# Patient Record
Sex: Female | Born: 1940 | Race: White | Hispanic: No | Marital: Married | Smoking: Former smoker
Health system: Southern US, Community
[De-identification: ages and names within clinical notes are randomized; demographics above are authoritative.]

## PROBLEM LIST (undated history)

## (undated) DIAGNOSIS — F0390 Unspecified dementia without behavioral disturbance: Secondary | ICD-10-CM

---

## 2011-04-19 ENCOUNTER — Other Ambulatory Visit: Payer: Self-pay | Admitting: Neurology

## 2011-04-19 DIAGNOSIS — R413 Other amnesia: Secondary | ICD-10-CM

## 2011-05-17 ENCOUNTER — Inpatient Hospital Stay: Admission: RE | Admit: 2011-05-17 | Payer: Self-pay | Source: Ambulatory Visit

## 2011-05-24 ENCOUNTER — Ambulatory Visit
Admission: RE | Admit: 2011-05-24 | Discharge: 2011-05-24 | Disposition: A | Discharge status: Home / Self Care | Payer: Self-pay | Source: Ambulatory Visit | Attending: Neurology | Admitting: Neurology

## 2011-05-24 DIAGNOSIS — R413 Other amnesia: Secondary | ICD-10-CM

## 2011-06-21 ENCOUNTER — Other Ambulatory Visit: Payer: Self-pay | Admitting: Neurology

## 2011-06-21 DIAGNOSIS — R413 Other amnesia: Secondary | ICD-10-CM

## 2012-02-23 ENCOUNTER — Ambulatory Visit: Payer: Self-pay | Admitting: Family Medicine

## 2014-01-28 ENCOUNTER — Ambulatory Visit: Payer: Self-pay | Admitting: Family Medicine

## 2014-12-03 ENCOUNTER — Other Ambulatory Visit: Payer: Self-pay | Admitting: Neurology

## 2014-12-03 DIAGNOSIS — F0151 Vascular dementia with behavioral disturbance: Secondary | ICD-10-CM

## 2014-12-03 DIAGNOSIS — G20 Parkinson's disease: Secondary | ICD-10-CM

## 2014-12-03 DIAGNOSIS — F01518 Vascular dementia, unspecified severity, with other behavioral disturbance: Secondary | ICD-10-CM

## 2014-12-10 ENCOUNTER — Ambulatory Visit
Admission: RE | Admit: 2014-12-10 | Discharge: 2014-12-10 | Disposition: A | Discharge status: Home / Self Care | Payer: Medicare PPO | Source: Ambulatory Visit | Attending: Neurology | Admitting: Neurology

## 2014-12-10 DIAGNOSIS — G20 Parkinson's disease: Secondary | ICD-10-CM | POA: Diagnosis present

## 2014-12-10 DIAGNOSIS — I679 Cerebrovascular disease, unspecified: Secondary | ICD-10-CM | POA: Diagnosis not present

## 2014-12-10 DIAGNOSIS — J3489 Other specified disorders of nose and nasal sinuses: Secondary | ICD-10-CM | POA: Insufficient documentation

## 2014-12-10 DIAGNOSIS — F01518 Vascular dementia, unspecified severity, with other behavioral disturbance: Secondary | ICD-10-CM

## 2014-12-10 DIAGNOSIS — G319 Degenerative disease of nervous system, unspecified: Secondary | ICD-10-CM | POA: Insufficient documentation

## 2014-12-10 DIAGNOSIS — F0151 Vascular dementia with behavioral disturbance: Secondary | ICD-10-CM | POA: Insufficient documentation

## 2014-12-10 MED ORDER — GADOBENATE DIMEGLUMINE 529 MG/ML IV SOLN: 20.0000 mL | Freq: Once | INTRAVENOUS | Status: AC | PRN

## 2014-12-10 MED ORDER — GADOBENATE DIMEGLUMINE 529 MG/ML IV SOLN
20.0000 mL | Freq: Once | INTRAVENOUS | Status: AC | PRN
  Administered 2014-12-10: 19 mL via INTRAVENOUS

## 2015-02-06 ENCOUNTER — Encounter: Payer: Self-pay | Admitting: Emergency Medicine

## 2015-02-06 ENCOUNTER — Emergency Department
Admission: EM | Admit: 2015-02-06 | Discharge: 2015-02-06 | Disposition: A | Discharge status: Home / Self Care | Payer: Medicare PPO | Attending: Emergency Medicine | Admitting: Emergency Medicine

## 2015-02-06 ENCOUNTER — Other Ambulatory Visit: Payer: Self-pay

## 2015-02-06 ENCOUNTER — Emergency Department: Payer: Medicare PPO

## 2015-02-06 DIAGNOSIS — M62830 Muscle spasm of back: Secondary | ICD-10-CM | POA: Diagnosis not present

## 2015-02-06 DIAGNOSIS — R000 Tachycardia, unspecified: Secondary | ICD-10-CM | POA: Insufficient documentation

## 2015-02-06 DIAGNOSIS — M5489 Other dorsalgia: Secondary | ICD-10-CM | POA: Diagnosis not present

## 2015-02-06 DIAGNOSIS — Z87891 Personal history of nicotine dependence: Secondary | ICD-10-CM | POA: Insufficient documentation

## 2015-02-06 DIAGNOSIS — R319 Hematuria, unspecified: Secondary | ICD-10-CM

## 2015-02-06 DIAGNOSIS — M549 Dorsalgia, unspecified: Secondary | ICD-10-CM | POA: Diagnosis present

## 2015-02-06 HISTORY — DX: Unspecified dementia, unspecified severity, without behavioral disturbance, psychotic disturbance, mood disturbance, and anxiety: F03.90

## 2015-02-06 LAB — CBC WITH DIFFERENTIAL/PLATELET
Basophils Absolute: 0 10*3/uL (ref 0–0.1)
Basophils Relative: 0 %
Eosinophils Absolute: 0 10*3/uL (ref 0–0.7)
Eosinophils Relative: 0 %
HEMATOCRIT: 43.8 % (ref 35.0–47.0)
HEMOGLOBIN: 14.9 g/dL (ref 12.0–16.0)
LYMPHS ABS: 2 10*3/uL (ref 1.0–3.6)
LYMPHS PCT: 14 %
MCH: 29.5 pg (ref 26.0–34.0)
MCHC: 33.9 g/dL (ref 32.0–36.0)
MCV: 87 fL (ref 80.0–100.0)
MONOS PCT: 9 %
Monocytes Absolute: 1.2 10*3/uL — ABNORMAL HIGH (ref 0.2–0.9)
NEUTROS ABS: 10.6 10*3/uL — AB (ref 1.4–6.5)
NEUTROS PCT: 77 %
Platelets: 231 10*3/uL (ref 150–440)
RBC: 5.04 MIL/uL (ref 3.80–5.20)
RDW: 13.5 % (ref 11.5–14.5)
WBC: 13.7 10*3/uL — AB (ref 3.6–11.0)

## 2015-02-06 LAB — LACTIC ACID, PLASMA
Lactic Acid, Venous: 2.9 mmol/L (ref 0.5–2.0)
Lactic Acid, Venous: 1.9 mmol/L (ref 0.5–2.0)

## 2015-02-06 LAB — URINALYSIS COMPLETE WITH MICROSCOPIC (ARMC ONLY)
BILIRUBIN URINE: NEGATIVE
Bacteria, UA: NONE SEEN
Glucose, UA: NEGATIVE mg/dL
Leukocytes, UA: NEGATIVE
NITRITE: NEGATIVE
Protein, ur: NEGATIVE mg/dL
SPECIFIC GRAVITY, URINE: 1.024 (ref 1.005–1.030)
pH: 6 (ref 5.0–8.0)

## 2015-02-06 LAB — COMPREHENSIVE METABOLIC PANEL
ALK PHOS: 55 U/L (ref 38–126)
ALT: 13 U/L — AB (ref 14–54)
AST: 29 U/L (ref 15–41)
Albumin: 4.5 g/dL (ref 3.5–5.0)
Anion gap: 12 (ref 5–15)
BUN: 20 mg/dL (ref 6–20)
CALCIUM: 10.4 mg/dL — AB (ref 8.9–10.3)
CO2: 24 mmol/L (ref 22–32)
CREATININE: 0.82 mg/dL (ref 0.44–1.00)
Chloride: 102 mmol/L (ref 101–111)
GFR calc non Af Amer: >60 mL/min (ref >60)
GLUCOSE: 97 mg/dL (ref 65–99)
Potassium: 4.7 mmol/L (ref 3.5–5.1)
SODIUM: 138 mmol/L (ref 135–145)
Total Bilirubin: 0.8 mg/dL (ref 0.3–1.2)
Total Protein: 7.7 g/dL (ref 6.5–8.1)

## 2015-02-06 LAB — TROPONIN I

## 2015-02-06 LAB — LIPASE, BLOOD: Lipase: 21 U/L — ABNORMAL LOW (ref 22–51)

## 2015-02-06 MED ORDER — SODIUM CHLORIDE 0.9 % IV BOLUS (SEPSIS)
1000.0000 mL | Freq: Once | INTRAVENOUS | Status: AC
  Administered 2015-02-06: 1000 mL via INTRAVENOUS

## 2015-02-06 MED ORDER — DIAZEPAM 2 MG PO TABS
2.0000 mg | ORAL_TABLET | Freq: Once | ORAL | Status: AC
  Administered 2015-02-06: 2 mg via ORAL
  Filled 2015-02-06: qty 1

## 2015-02-06 MED ORDER — DIAZEPAM 2 MG PO TABS: 2.0000 mg | ORAL_TABLET | Freq: Three times a day (TID) | ORAL | Status: AC | PRN

## 2015-02-06 MED ORDER — KETOROLAC TROMETHAMINE 30 MG/ML IJ SOLN
30.0000 mg | Freq: Once | INTRAMUSCULAR | Status: AC
  Administered 2015-02-06: 30 mg via INTRAVENOUS
  Filled 2015-02-06: qty 1

## 2015-02-06 MED ORDER — IBUPROFEN 600 MG PO TABS
600.0000 mg | ORAL_TABLET | Freq: Once | ORAL | Status: AC
  Administered 2015-02-06: 600 mg via ORAL
  Filled 2015-02-06: qty 1

## 2015-02-06 NOTE — Discharge Instructions (Signed)
I suspect your back pain is due to muscle spasms, and your being started on an as needed medication called Valium for muscle spasms. Return to emergency department for any worsening condition including weakness, numbness, incontinence of urine or stool, fever, altered mental status, or any other symptoms concerning to you. Your urine showed blood in it, and I recommend follow-up with a urologist. If primary care physician can refer you for urology follow-up.   Back Pain, Adult Back pain is very common. The pain often gets better over time. The cause of back pain is usually not dangerous. Most people can learn to manage their back pain on their own.  HOME CARE   Stay active. Start with short walks on flat ground if you can. Try to walk farther each day.  Do not sit, drive, or stand in one place for more than 30 minutes. Do not stay in bed.  Do not avoid exercise or work. Activity can help your back heal faster.  Be careful when you bend or lift an object. Bend at your knees, keep the object close to you, and do not twist.  Sleep on a firm mattress. Lie on your side, and bend your knees. If you lie on your back, put a pillow under your knees.  Only take medicines as told by your doctor.  Put ice on the injured area.  Put ice in a plastic bag.  Place a towel between your skin and the bag.  Leave the ice on for 15-20 minutes, 03-04 times a day for the first 2 to 3 days. After that, you can switch between ice and heat packs.  Ask your doctor about back exercises or massage.  Avoid feeling anxious or stressed. Find good ways to deal with stress, such as exercise. GET HELP RIGHT AWAY IF:   Your pain does not go away with rest or medicine.  Your pain does not go away in 1 week.  You have new problems.  You do not feel well.  The pain spreads into your legs.  You cannot control when you poop (bowel movement) or pee (urinate).  Your arms or legs feel weak or lose feeling  (numbness).  You feel sick to your stomach (nauseous) or throw up (vomit).  You have belly (abdominal) pain.  You feel like you may pass out (faint). MAKE SURE YOU:   Understand these instructions.  Will watch your condition.  Will get help right away if you are not doing well or get worse. Document Released: 11/17/2007 Document Revised: 08/23/2011 Document Reviewed: 10/02/2013 [facility] Patient Information 2015 [place], [place]. This information is not intended to replace advice given to you by your health care provider. Make sure you discuss any questions you have with your health care provider.  Musculoskeletal Pain Musculoskeletal pain is muscle and boney aches and pains. These pains can occur in any part of the body. Your caregiver may treat you without knowing the cause of the pain. They may treat you if blood or urine tests, X-rays, and other tests were normal.  CAUSES There is often not a definite cause or reason for these pains. These pains may be caused by a type of germ (virus). The discomfort may also come from overuse. Overuse includes working out too hard when your body is not fit. Boney aches also come from weather changes. Bone is sensitive to atmospheric pressure changes. HOME CARE INSTRUCTIONS   Ask when your test results will be ready. Make sure you get your test  results.  Only take over-the-counter or prescription medicines for pain, discomfort, or fever as directed by your caregiver. If you were given medications for your condition, do not drive, operate machinery or power tools, or sign legal documents for 24 hours. Do not drink alcohol. Do not take sleeping pills or other medications that may interfere with treatment.  Continue all activities unless the activities cause more pain. When the pain lessens, slowly resume normal activities. Gradually increase the intensity and duration of the activities or exercise.  During periods of severe pain, bed rest may be helpful.  Lay or sit in any position that is comfortable.  Putting ice on the injured area.  Put ice in a bag.  Place a towel between your skin and the bag.  Leave the ice on for 15 to 20 minutes, 3 to 4 times a day.  Follow up with your caregiver for continued problems and no reason can be found for the pain. If the pain becomes worse or does not go away, it may be necessary to repeat tests or do additional testing. Your caregiver may need to look further for a possible cause. SEEK IMMEDIATE MEDICAL CARE IF:  You have pain that is getting worse and is not relieved by medications.  You develop chest pain that is associated with shortness or breath, sweating, feeling sick to your stomach (nauseous), or throw up (vomit).  Your pain becomes localized to the abdomen.  You develop any new symptoms that seem different or that concern you. MAKE SURE YOU:   Understand these instructions.  Will watch your condition.  Will get help right away if you are not doing well or get worse. Document Released: 05/31/2005 Document Revised: 08/23/2011 Document Reviewed: 02/02/2013 [facility] Patient Information 2015 [place], [place]. This information is not intended to replace advice given to you by your health care provider. Make sure you discuss any questions you have with your health care provider.    Hematuria Hematuria is blood in your urine. It can be caused by a bladder infection, kidney infection, prostate infection, kidney stone, or cancer of your urinary tract. Infections can usually be treated with medicine, and a kidney stone usually will pass through your urine. If neither of these is the cause of your hematuria, further workup to find out the reason may be needed. It is very important that you tell your health care provider about any blood you see in your urine, even if the blood stops without treatment or happens without causing pain. Blood in your urine that happens and then stops and then happens  again can be a symptom of a very serious condition. Also, pain is not a symptom in the initial stages of many urinary cancers. HOME CARE INSTRUCTIONS   Drink lots of fluid, 3-4 quarts a day. If you have been diagnosed with an infection, cranberry juice is especially recommended, in addition to large amounts of water.  Avoid caffeine, tea, and carbonated beverages because they tend to irritate the bladder.  Avoid alcohol because it may irritate the prostate.  Take all medicines as directed by your health care provider.  If you were prescribed an antibiotic medicine, finish it all even if you start to feel better.  If you have been diagnosed with a kidney stone, follow your health care provider's instructions regarding straining your urine to catch the stone.  Empty your bladder often. Avoid holding urine for long periods of time.  After a bowel movement, women should cleanse front to back.  Use each tissue only once.  Empty your bladder before and after sexual intercourse if you are a female. SEEK MEDICAL CARE IF:  You develop back pain.  You have a fever.  You have a feeling of sickness in your stomach (nausea) or vomiting.  Your symptoms are not better in 3 days. Return sooner if you are getting worse. SEEK IMMEDIATE MEDICAL CARE IF:   You develop severe vomiting and are unable to keep the medicine down.  You develop severe back or abdominal pain despite taking your medicines.  You begin passing a large amount of blood or clots in your urine.  You feel extremely weak or faint, or you pass out. MAKE SURE YOU:   Understand these instructions.  Will watch your condition.  Will get help right away if you are not doing well or get worse. Document Released: 05/31/2005 Document Revised: 10/15/2013 Document Reviewed: 01/29/2013 [facility] Patient Information 2015 [place], [place]. This information is not intended to replace advice given to you by your health care provider. Make  sure you discuss any questions you have with your health care provider.

## 2015-02-06 NOTE — ED Notes (Signed)
From home via EMS with c/o back pain, denies pain on arrival, VSS, no trauma, neuro intact to baseline, NAD

## 2015-02-06 NOTE — ED Provider Notes (Signed)
-----------------------------------------   545 PM on 02/06/2015 -----------------------------------------  Patient presented with 1 day of worsening back pain. Spasm felt on exam by Dr. Shaune Pollack. No known trauma. Given by mouth Valium for muscle spasm by Dr. Shaune Pollack. Plan is to reassess as well as follow-up with studies a renal stone because of hematuria. Physical Exam  BP 144/93 mmHg  Pulse 95  Temp(Src) 98.2 F (36.8 C) (Oral)  Resp 18  SpO2 95%  Physical Exam Patient resting comfortable at this time and able to shift herself in the bed without any severe back pain. ED Course  Procedures  MDM CAT scan renal study without any findings to explain the patient's back pain and hematuria. No acute disease on the plain films.  Ultrasound-guided IV started in the right arm secondary to multiple unsuccessful IV sticks. 20-gauge long catheter used for IV access. Site prepped sterilely. There was a good return of maroon blood which was nonpulsatile. Easily flushes and draws back.   ED ECG REPORT I, Arelia Longest, the attending physician, personally viewed and interpreted this ECG.   Date: 02/06/2015  EKG Time: 1821  Rate: 87  Rhythm: normal sinus rhythm  Axis: Normal axis  Intervals:Incomplete right bundle-branch block  ST&T Change: T-wave inversion in V2 possibly from lead placement. Otherwise the EKG does not have any abnormal T-wave inversions. No  ST elevations or depressions.  ----------------------------------------- 7:29 PM on 02/06/2015 -----------------------------------------  Patient is now able to ambulate with assistance. No palpable tenderness to the back at this time. Patient is sitting now on the couch in the room without any acute distress. Unclear cause of the elevated lactate however patient does not have any obvious infectious source. Patient was afebrile and pulse normalized. Possibly due to pain and muscle contraction in the back. We'll discharge to home. Pain  initially was lateralizing.  No saddle anesthesia on my exam. Unlikely cord compression syndrome or infectious source such as epidural abscess.   Myrna Blazer, MD 02/06/15 [phone]

## 2015-02-06 NOTE — ED Provider Notes (Addendum)
[facility] Emergency Department Provider Note   ____________________________________________  Time seen: 1:20 PM I have reviewed the triage vital signs and the triage nursing note.  HISTORY  Chief Complaint Back Pain   Historian Patient is a poor historian due to dementia. History provided by the care home staff, as well as a granddaughter is at the bedside.  HPI Erika Ramirez is a 74 y.o. female who has a history of Parkinson's dementia and for 2 days has had intermittent complaints of back pain. Yesterday upon waking she was unable to get out of bed herself and was complaining of severe back pain. EMS was called at that point in time and by the time they came to the home, the patient was able to get up on her own and walk around and then she did well the rest of the day. This morning the staff went in to get her up and she was laying sideways in the bed and she was complaining of pain any time she was moved in her back. She's been in bed all day and hasn't been able to get up due to back pain. No known trauma. No altered mental status from baseline. No recent illness or appetite changes.    Past Medical History  Diagnosis Date  . Dementia     There are no active problems to display for this patient.   No past surgical history on file.  Current Outpatient Rx  Name  Route  Sig  Dispense  Refill  . diazepam (VALIUM) 2 MG tablet   Oral   Take 1 tablet (2 mg total) by mouth every 8 (eight) hours as needed for muscle spasms.   10 tablet   0     Allergies Review of patient's allergies indicates not on file.  No family history on file.  Social History Social History  Substance Use Topics  . Smoking status: Former Games developer  . Smokeless tobacco: Not on file  . Alcohol Use: No    Review of Systems  Constitutional: Negative for fever. Eyes: Negative for visual changes. ENT: Negative for sore throat. Cardiovascular: Negative for chest  pain. Respiratory: Negative for shortness of breath. Gastrointestinal: Negative for abdominal pain, vomiting and diarrhea. Genitourinary: Negative for dysuria. Musculoskeletal: Negative for extremity pain Skin: Negative for rash. Neurological: Negative for headache. 10 point Review of Systems otherwise negative ____________________________________________   PHYSICAL EXAM:  VITAL SIGNS: ED Triage Vitals  Enc Vitals Group     BP 02/06/15 1215 143/108 mmHg     Pulse Rate 02/06/15 1215 108     Resp --      Temp 02/06/15 1215 98.2 F (36.8 C)     Temp Source 02/06/15 1215 Oral     SpO2 02/06/15 1215 95 %     Weight --      Height --      Head Cir --      Peak Flow --      Pain Score --      Pain Loc --      Pain Edu? --      Excl. in GC? --      Constitutional: Alert and follows some commands, but confused and a poor historian. In no distress. Eyes: Conjunctivae are normal. PERRL. Normal extraocular movements. ENT   Head: Normocephalic and atraumatic.   Nose: No congestion/rhinnorhea.   Mouth/Throat: Mucous membranes are moist.   Neck: No stridor. Cardiovascular/Chest: Regular and tachycardic.  No murmurs, rubs, or gallops.  Respiratory: Normal respiratory effort without tachypnea nor retractions. Breath sounds are clear and equal bilaterally. No wheezes/rales/rhonchi. Gastrointestinal: Soft. No distention, no guarding, no rebound. Nontender   Genitourinary/rectal:Deferred Musculoskeletal: Nontender with normal range of motion in all extremities. No joint effusions.  No lower extremity tenderness.  No edema. Patient seems to be tender all up and down her back from midthoracic down to the lumbar. She does have palpable stiff muscle spasm paraspinous especially on the right side of her back. Neurologic:  Minimal language. Patient is reportedly at her baseline mental status. No gross or focal neurologic deficits are appreciated. Skin:  Skin is warm, dry and intact.  No rash noted.   ____________________________________________   EKG I, Governor Rooks, MD, the attending physician have personally viewed and interpreted all ECGs.  Pending ____________________________________________  LABS (pertinent positives/negatives)  Urinalysis: Trace ketones, too numerous to count red blood cells and otherwise no sign of urinary tract infection Metabolic panel and CBC are pending  ____________________________________________  RADIOLOGY All Xrays were viewed by me. Imaging interpreted by Radiologist.  Chest x-ray:  IMPRESSION: Mild diffuse reticular densities are noted bilaterally concerning for possible edema or atypical inflammation. Follow-up radiographs are recommended.  CT stone study: Pending Lumbar spine 2 view: No acute fracture dislocation. Degenerative joint changes of the spine  Thoracic spine 2 view: No acute fracture or dislocation. Degenerative joint changes of the spine. __________________________________________  PROCEDURES  Procedure(s) performed: None  Critical Care performed: None  ____________________________________________   ED COURSE / ASSESSMENT AND PLAN  CONSULTATIONS: None  Pertinent labs & imaging results that were available during my care of the patient were reviewed by me and considered in my medical decision making (see chart for details).  It sounds as if clinically she might be having muscle spasms in her back that are limiting her movement, however I am going to assess with screening labs, urine, and x-rays to assess for compression fractures. I'm going to try Valium for symptom relief of what seems like muscle spasms in her back.   X-ray showed no fracture. Chest x-ray is nonspecific, and will recommend outpatient follow-up radiographs.  Urinalysis, blood work are pending. Patient care transferred to Dr. Langston Masker at shift change for p.m.  If laboratory evaluation is reassuring, patient will be discharged  with my discharge instructions a prescription for Valium.Marland Kitchen  ----------------------------------------- 3:51 PM on 02/06/2015 -----------------------------------------  Urinalysis returned with hematuria. In the department for a CT stone study. Labs, EKG, and CT stone study are pending for Dr. Langston Masker.  Patient / Family / Caregiver informed of clinical course, medical decision-making process, and agree with plan.    ___________________________________________   FINAL CLINICAL IMPRESSION(S) / ED DIAGNOSES   Final diagnoses:  Other back pain  Muscle spasm of back       Governor Rooks, MD 02/06/15 1547  Governor Rooks, MD 02/06/15 [phone]

## 2015-02-07 ENCOUNTER — Emergency Department
Admission: EM | Admit: 2015-02-07 | Discharge: 2015-02-07 | Disposition: A | Discharge status: Home / Self Care | Payer: Medicare PPO | Attending: Emergency Medicine | Admitting: Emergency Medicine

## 2015-02-07 ENCOUNTER — Encounter: Payer: Self-pay | Admitting: *Deleted

## 2015-02-07 DIAGNOSIS — G20 Parkinson's disease: Secondary | ICD-10-CM | POA: Insufficient documentation

## 2015-02-07 DIAGNOSIS — R531 Weakness: Secondary | ICD-10-CM

## 2015-02-07 DIAGNOSIS — M6281 Muscle weakness (generalized): Secondary | ICD-10-CM | POA: Insufficient documentation

## 2015-02-07 DIAGNOSIS — G20A1 Parkinson's disease without dyskinesia, without mention of fluctuations: Secondary | ICD-10-CM

## 2015-02-07 DIAGNOSIS — Z87891 Personal history of nicotine dependence: Secondary | ICD-10-CM | POA: Insufficient documentation

## 2015-02-07 DIAGNOSIS — M545 Low back pain: Secondary | ICD-10-CM | POA: Diagnosis present

## 2015-02-07 NOTE — Care Management Note (Signed)
Case Management Note  Patient Details  Name: Erika Ramirez MRN: [id number] Date of Birth: 1940-11-15  Subjective/Objective:   Spoke to Erika Hughs, FNP  At  [facility].She contacted Erika Ramirez, who will see the pt. 9:20am Monday morning. The family and caregiver both say that the pt. Just saw the MD on 8/10.  The practice has the contacts to work on placing pt. At a higher level of care if truly needed.  Although the caregiver states the pt. Screams in pain when moved, the pt. Is quiet and no c/o pain here in hospital.    This is the 3rd visit for same c/o of decreased mobility. They had already talked to Erika Ramirez about moving to another facility but state thay thought they would have more time to do so.              Action/Plan:   Expected Discharge Date:                  Expected Discharge Plan:     In-House Referral:     Discharge planning Services     Post Acute Care Choice:    Choice offered to:     DME Arranged:    DME Agency:     HH Arranged:    HH Agency:     Status of Service:     Medicare Important Message Given:    Date Medicare IM Given:    Medicare IM give by:    Date Additional Medicare IM Given:    Additional Medicare Important Message give by:     If discussed at Long Length of Stay Meetings, dates discussed:    Additional Comments:  Berna Bue, RN 02/07/2015, 1:15 PM

## 2015-02-07 NOTE — Clinical Social Work Note (Signed)
Clinical Social Work Assessment  Patient Details  Name: Erika Ramirez MRN: [id number] Date of Birth: 09-18-1940  Date of referral:  02/07/15               Reason for consult:  Facility Placement                Permission sought to share information with:  Facility Medical sales representative, Family Supports Permission granted to share information::  Yes, Verbal Permission Granted  Name::      (daughter Neysa Bonito [phone])  Agency::   [name] [phone]  Lupita Leash) )  Relationship::     Contact Information:     Housing/Transportation Living arrangements for the past 2 months:  Assisted Living Facility Source of Information:  Adult Children, Facility, Patient Patient Interpreter Needed:  None Criminal Activity/Legal Involvement Pertinent to Current Situation/Hospitalization:  No - Comment as needed Significant Relationships:  Adult Children (family care home) Lives with:    Do you feel safe going back to the place where you live?  Yes Need for family participation in patient care:  Yes (Comment)  Care giving concerns:  Concerned that patient will need a higher level of care and unable to meet needs at ALF.   Social Worker assessment / plan:  Erika Ramirez is a 74 y.o. female who presents from assisted living for pain.  Has come to ED several time in the past two days, facility thinks she needs a higher level of care, CSW in to meet with patient.  Daughter and family care home owner at bedside.  Patient currently lives at [facility] Per owner patient has had increased weakness and increasing pain in the low back.    Informed CSW patient has never been to SNF or had home health rehab in the past.  She has a follow up appointment in two weeks with PCP to review FL2 to determine if patient needs a higher level of care.  Care management was able to obtain an appointment for patient on Monday.  CSW provided patient's daughter with a SNF list to take with them in  the event they need SNF placement after appointment Monday.  Patient will discharge back to ALF with follow up appointment on Monday with PCP to determine patient's ongoing needs and level of care.   Employment status:  Disabled (Comment on whether or not currently receiving Disability) Insurance information:  Medicare, Managed Care PT Recommendations:  Not assessed at this time Information / Referral to community resources:   (follow up with PCP)  Patient/Family's Response to care:  Patient's daughter and ALF appreciative of getting the appointment sooner.   Patient/Family's Understanding of and Emotional Response to Diagnosis, Current Treatment, and Prognosis:  Patient's family understands patient will discharge back to ALF with follow up appointment on Monday.  Emotional Assessment Appearance:  Appears stated age Attitude/Demeanor/Rapport:    Affect (typically observed):  Appropriate, Calm, Pleasant, Quiet Orientation:  Oriented to Self Alcohol / Substance use:    Psych involvement (Current and /or in the community):     Discharge Needs  Concerns to be addressed:  Care Coordination Readmission within the last 30 days:  Yes Current discharge risk:  Physical Impairment, Chronically ill, Dependent with Mobility Barriers to Discharge:  No Barriers Identified   Erika Pilon, LCSW 02/07/2015, 1:32 PM Erika Ramirez. Erika Ramirez, MSW Clinical Social Work Department Emergency Room [phone] 1:35 PM

## 2015-02-07 NOTE — Discharge Instructions (Signed)
Parkinson Disease Parkinson disease is a disorder of the central nervous system, which includes the brain and spinal cord. A person with this disease slowly loses the ability to completely control body movements. Within the brain, there is a group of nerve cells (basal ganglia) that help control movement. The basal ganglia are damaged and do not work properly in a person with Parkinson disease. In addition, the basal ganglia produce and use a brain chemical called dopamine. The dopamine chemical sends messages to other parts of the body to control and coordinate body movements. Dopamine levels are low in a person with Parkinson disease. If the dopamine levels are low, then the body does not receive the correct messages it needs to move normally.  CAUSES  The exact reason why the basal ganglia get damaged is not known. Some medical researchers have thought that infection, genes, environment, and certain medicines may contribute to the cause.  SYMPTOMS   An early symptom of Parkinson disease is often an uncontrolled shaking (tremor) of the hands. The tremor will often disappear when the affected hand is consciously used.  As the disease progresses, walking, talking, getting out of a chair, and new movements become more difficult.  Muscles get stiff and movements become slower.  Balance and coordination become harder.  Depression, trouble swallowing, urinary problems, constipation, and sleep problems can occur.  Later in the disease, memory and thought processes may deteriorate. DIAGNOSIS  There are no specific tests to diagnose Parkinson disease. You may be referred to a neurologist for evaluation. Your caregiver will ask about your medical history, symptoms, and perform a physical exam. Blood tests and imaging tests of your brain may be performed to rule out other diseases. The imaging tests may include an MRI or a CT scan. TREATMENT  The goal of treatment is to relieve symptoms. Medicines may be  prescribed once the symptoms become troublesome. Medicine will not stop the progression of the disease, but medicine can make movement and balance better and help control tremors. Speech and occupational therapy may also be prescribed. Sometimes, surgical treatment of the brain can be done in young people. HOME CARE INSTRUCTIONS  Get regular exercise and rest periods during the day to help prevent exhaustion and depression.  If getting dressed becomes difficult, replace buttons and zippers with Velcro and elastic on your clothing.  Take all medicine as directed by your caregiver.  Install grab bars or railings in your home to prevent falls.  Go to speech or occupational therapy as directed.  Keep all follow-up visits as directed by your caregiver. SEEK MEDICAL CARE IF:  Your symptoms are not controlled with your medicine.  You fall.  You have trouble swallowing or choke on your food. MAKE SURE YOU:  Understand these instructions.  Will watch your condition.  Will get help right away if you are not doing well or get worse. Document Released: 05/28/2000 Document Revised: 09/25/2012 Document Reviewed: 06/30/2011 [employer] Patient Information 2015 [employer]. This information is not intended to replace advice given to you by your health care provider. Make sure you discuss any questions you have with your health care provider.  

## 2015-02-07 NOTE — ED Provider Notes (Addendum)
[facility] Emergency Department Provider Note     Time seen: ----------------------------------------- 12:01 PM on 02/07/2015 -----------------------------------------    I have reviewed the triage vital signs and the nursing notes. L5 caveat: Review of systems and history difficult to obtain due to Parkinson's and dementia  HISTORY  Chief Complaint Back Pain    HPI Erika Ramirez is a 74 y.o. female who presents from assisted living for pain. According to report the facility thinks she needs a higher level of care, was told to come to the ER for admission to the hospital. Patient has had increased weakness and increasing pain in the low back. She had been taking Valium for muscle spasms, was able to stand up with assistance by EMS.   Past Medical History  Diagnosis Date  . Dementia     There are no active problems to display for this patient.   History reviewed. No pertinent past surgical history.  Allergies Review of patient's allergies indicates no known allergies.  Social History Social History  Substance Use Topics  . Smoking status: Former Games developer  . Smokeless tobacco: None  . Alcohol Use: No    Review of Systems Constitutional: Negative for fever. Eyes: Negative for visual changes. ENT: Negative for sore throat. Cardiovascular: Negative for chest pain. Respiratory: Negative for shortness of breath. Gastrointestinal: Negative for abdominal pain, vomiting and diarrhea. Genitourinary: Negative for dysuria. Musculoskeletal: Positive for low back pain Skin: Negative for rash. Neurological: Negative for headaches, positive for weakness  10-point ROS otherwise negative.  ____________________________________________   PHYSICAL EXAM:  VITAL SIGNS: ED Triage Vitals  Enc Vitals Group     BP 02/07/15 1116 136/78 mmHg     Pulse Rate 02/07/15 1116 88     Resp --      Temp 02/07/15 1116 98.5 F (36.9 C)     Temp Source 02/07/15  1116 Oral     SpO2 02/07/15 1116 96 %     Weight 02/07/15 1116 200 lb (90.719 kg)     Height 02/07/15 1116  (1.626 m)     Head Cir --      Peak Flow --      Pain Score --      Pain Loc --      Pain Edu? --      Excl. in GC? --     Constitutional: Alert but disoriented. Well appearing and in no distress. Eyes: Conjunctivae are normal. PERRL. Normal extraocular movements. ENT   Head: Normocephalic and atraumatic.   Nose: No congestion/rhinnorhea.   Mouth/Throat: Mucous membranes are moist.   Neck: No stridor. Cardiovascular: Normal rate, regular rhythm. Normal and symmetric distal pulses are present in all extremities. No murmurs, rubs, or gallops. Respiratory: Normal respiratory effort without tachypnea nor retractions. Breath sounds are clear and equal bilaterally. No wheezes/rales/rhonchi. Gastrointestinal: Soft and nontender. No distention. No abdominal bruits.  Musculoskeletal: Nontender with mild pain with range of motion of her lower extremities Mild edema  Neurologic:  Normal speech and language Skin:  Skin is warm, dry and intact. No rash noted.  ____________________________________________  ED COURSE:  Pertinent labs & imaging results that were available during my care of the patient were reviewed by me and considered in my medical decision making (see chart for details). Patient is in no acute distress, likely needs a higher level of care or skilled rehabilitation placement. Care manager stated have been advised that we do not provide this service to the ER  ____________________________________________  FINAL ASSESSMENT AND PLAN  Weakness, Parkinson's  Plan: Patient with labs and imaging as dictated above. I have discussed the case with her primary care doctor and advised that she needs to arrange this as an outpatient. However this is her third visit in 3 days, we'll have PT evaluate and discuss with social work.   Emily Filbert,  MD  Patient will be seen by her primary care doctor on Monday morning and will have placement arranged at this time.   Emily Filbert, MD 02/07/15 1310  Emily Filbert, MD 02/07/15 1400

## 2015-02-07 NOTE — ED Notes (Signed)
Pt here from a group home, here to ED 3rd day in a row for same,  Care at facility wants pt admitted.

## 2015-06-13 ENCOUNTER — Other Ambulatory Visit: Payer: Self-pay | Admitting: Family Medicine

## 2015-06-13 ENCOUNTER — Ambulatory Visit
Admission: RE | Admit: 2015-06-13 | Discharge: 2015-06-13 | Disposition: A | Discharge status: Home / Self Care | Payer: Medicare PPO | Source: Ambulatory Visit | Attending: Family Medicine | Admitting: Family Medicine

## 2015-06-13 DIAGNOSIS — R9389 Abnormal findings on diagnostic imaging of other specified body structures: Secondary | ICD-10-CM

## 2015-06-13 DIAGNOSIS — R918 Other nonspecific abnormal finding of lung field: Secondary | ICD-10-CM | POA: Diagnosis present

## 2016-11-20 IMAGING — CR DG LUMBAR SPINE 2-3V
3 series · 3 of 3 positions shown · non-contrast
Comparison: None.

CLINICAL DATA: Patient complains of back pain when she moves. No
injury.

EXAM:
LUMBAR SPINE - 2-3 VIEW

[l-spine ap]
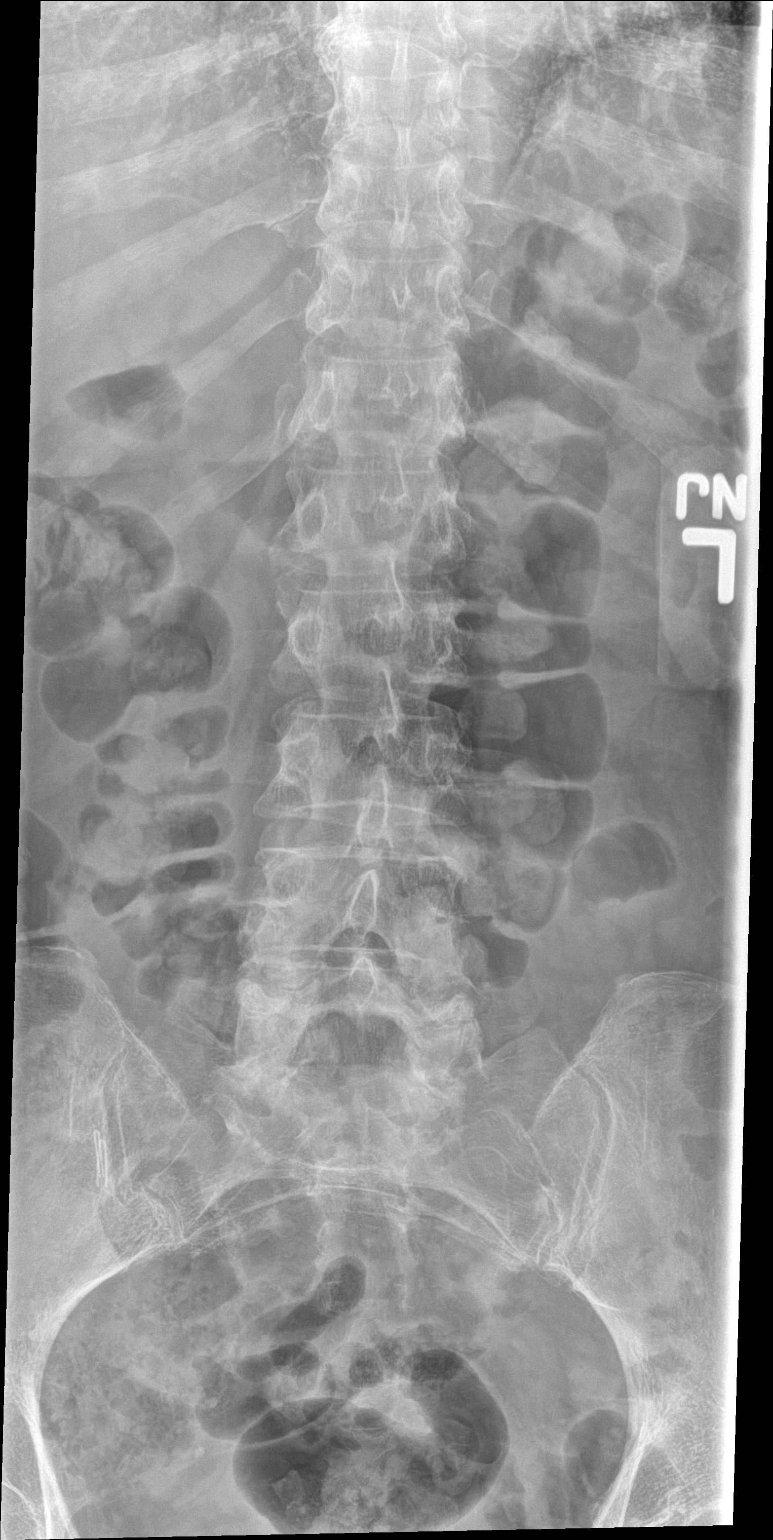

[l-spine lat]
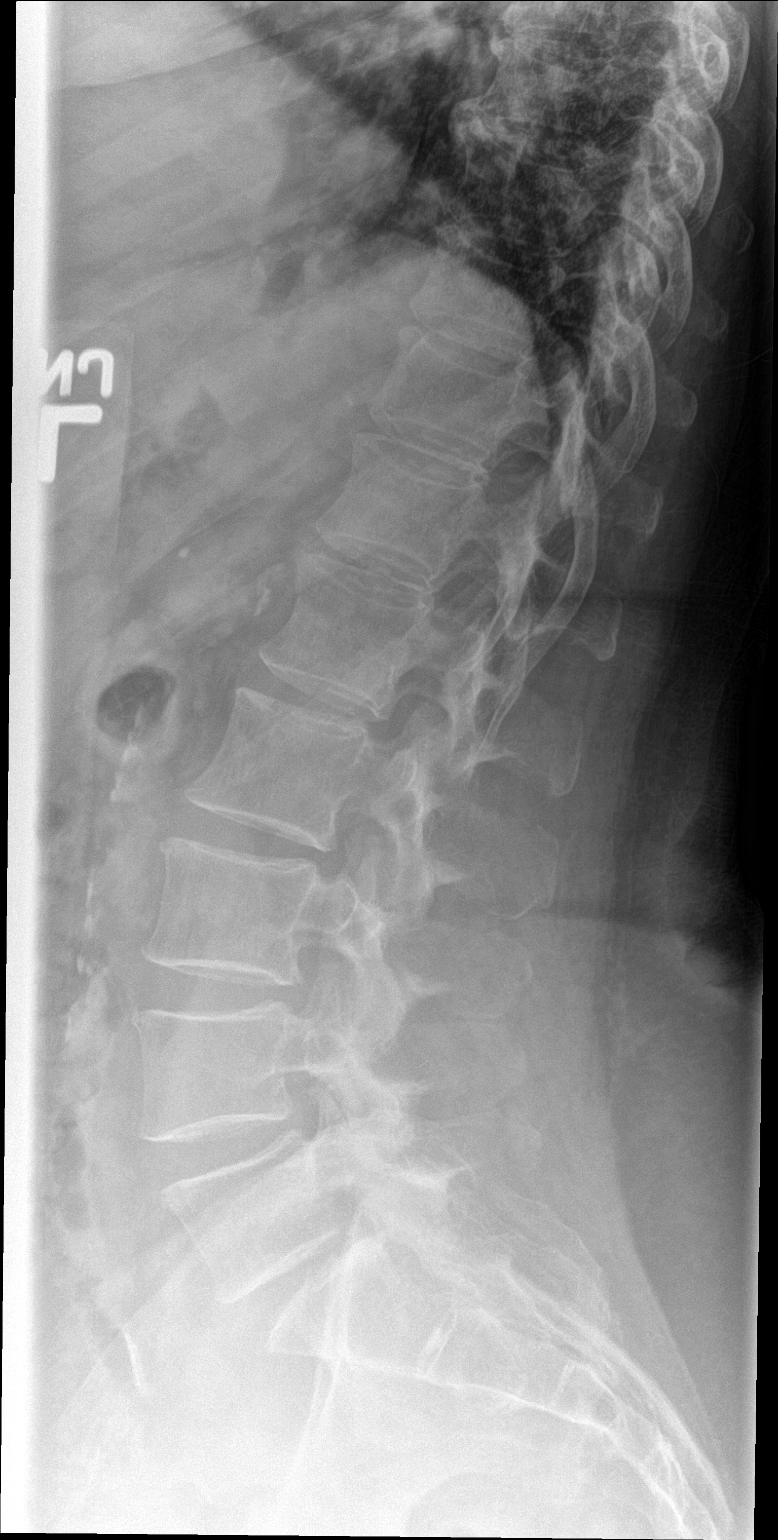

[l-spine spot]
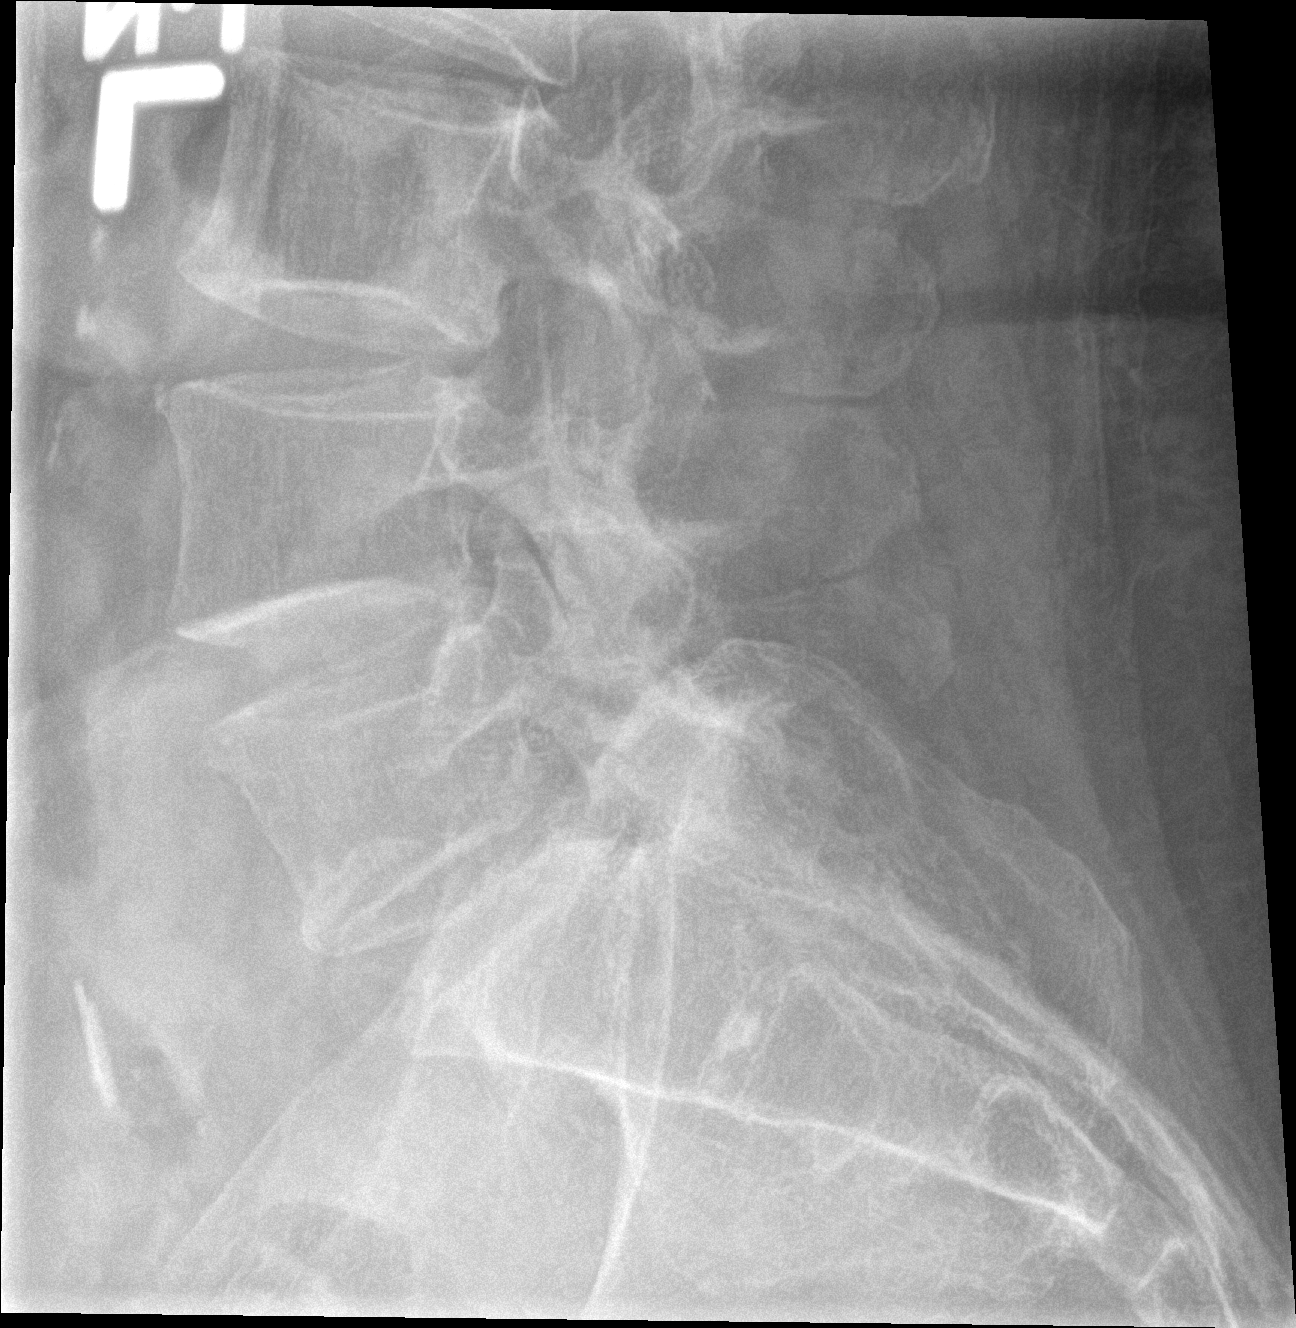

[3 of 3 positions shown; findings below may reference images not displayed]

FINDINGS: There is no evidence of lumbar spine fracture. Alignment is normal.
There degenerative joint changes of the visualized lower thoracic
spine. There is decreased intervertebral space at L5-S1.
IMPRESSION: No acute fracture or dislocation. Degenerative joint changes of
spine.

## 2016-11-20 IMAGING — CR DG THORACIC SPINE 2V
2 series · 2 of 2 positions shown · non-contrast
Comparison: None.

CLINICAL DATA: Patient complains of back pain when she moves.

EXAM:
THORACIC SPINE 2 VIEWS

[t-spine ap]
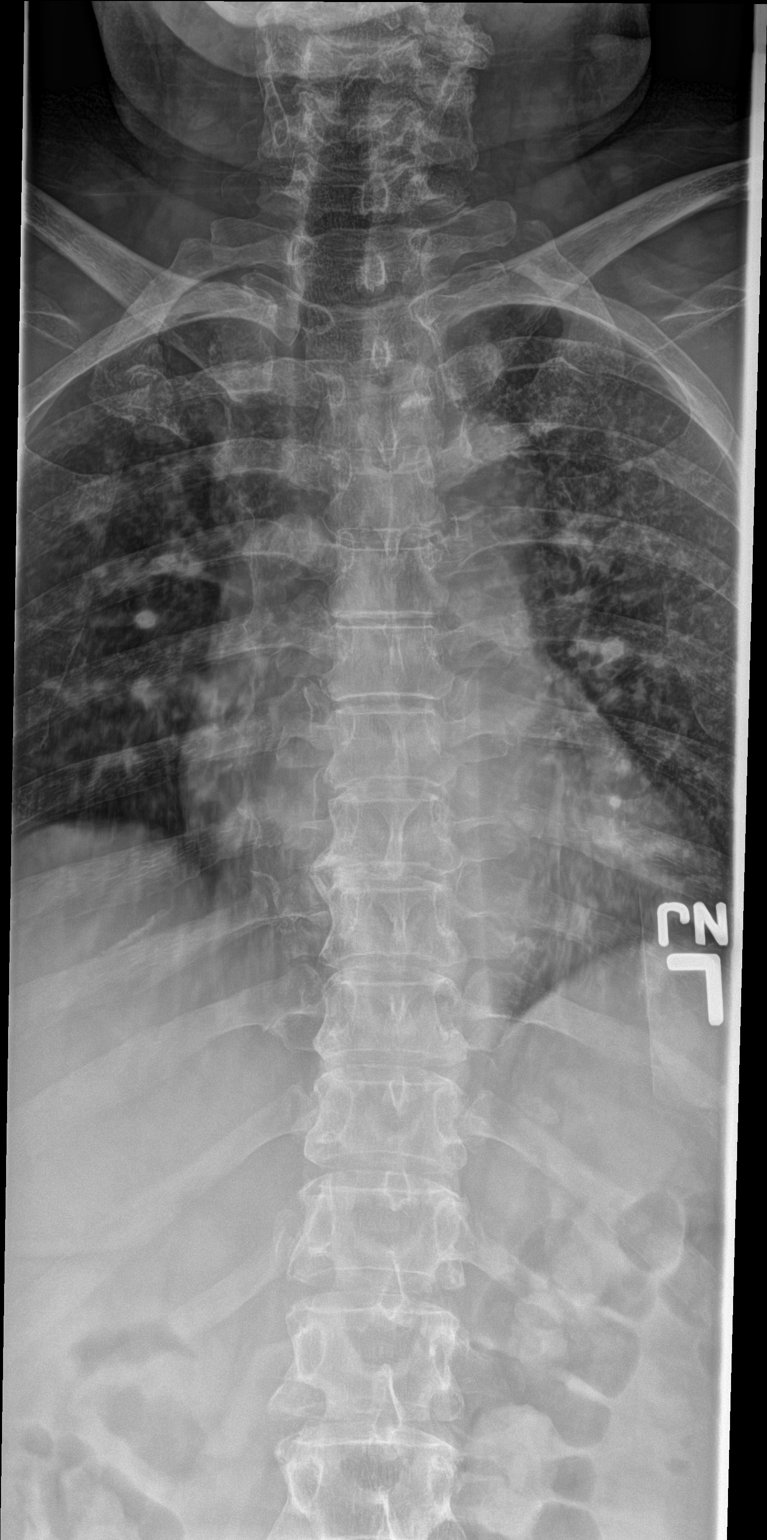

[t-spine lat]
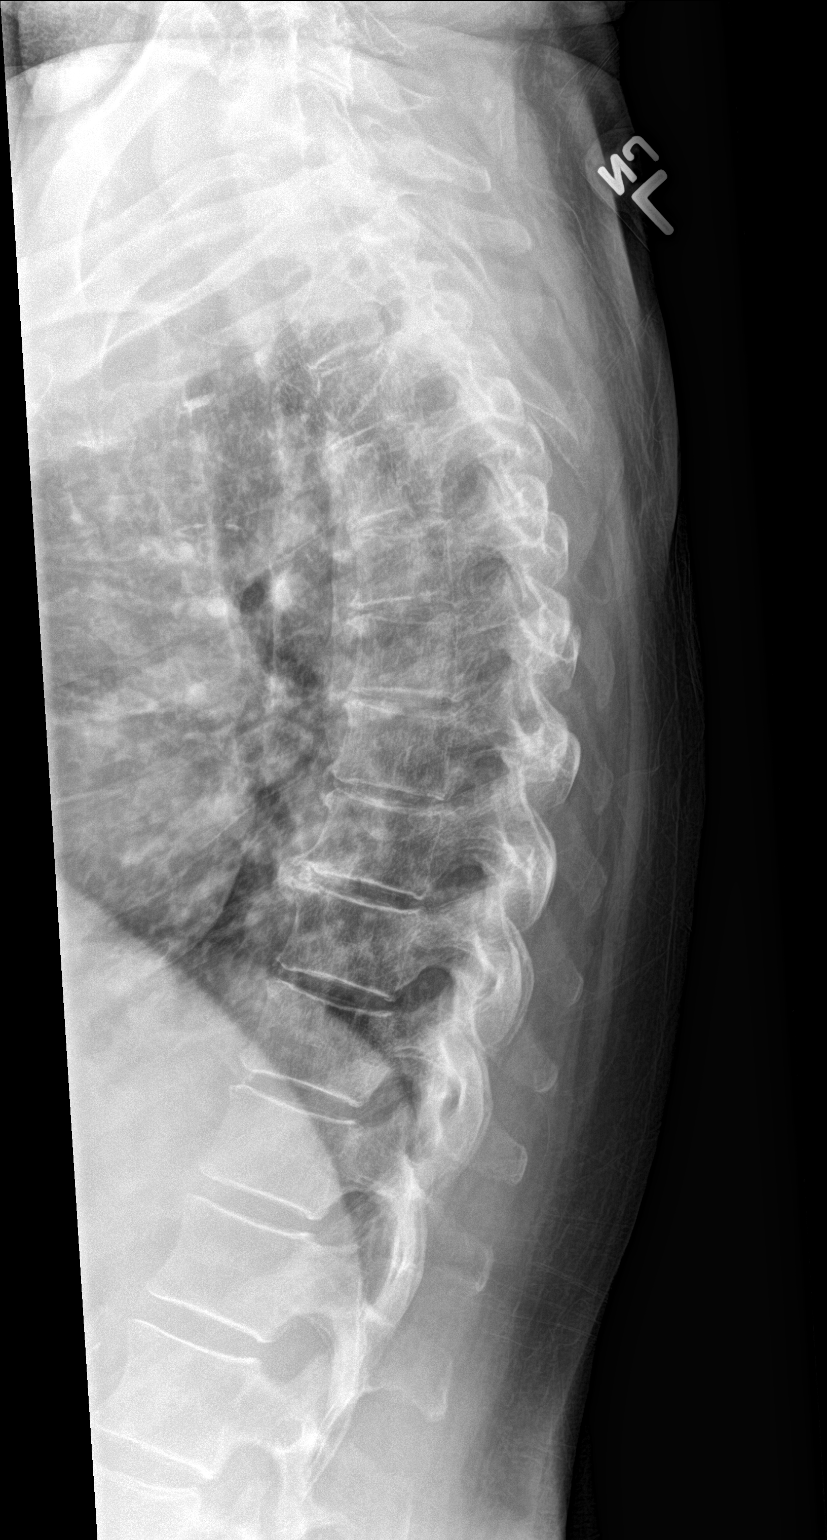

[2 of 2 positions shown; findings below may reference images not displayed]

FINDINGS: There is no evidence of thoracic spine fracture. Alignment is
normal. Degenerative joint changes with narrowed joint space and
osteophyte formation are noted.
IMPRESSION: No acute fracture or dislocation. Mild degenerative joint changes of
thoracic spine.

## 2017-01-12 DEATH — deceased
